# Patient Record
Sex: Female | Born: 1995 | Race: White | Hispanic: No | Marital: Single | Smoking: Never smoker
Health system: Southern US, Community
[De-identification: ages and names within clinical notes are randomized; demographics above are authoritative.]

## PROBLEM LIST (undated history)

## (undated) HISTORY — PX: WRIST SURGERY: SHX841

---
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
## 2017-02-27 ENCOUNTER — Emergency Department (INDEPENDENT_AMBULATORY_CARE_PROVIDER_SITE_OTHER)
Admission: EM | Admit: 2017-02-27 | Discharge: 2017-02-27 | Disposition: A | Discharge status: Home / Self Care | Payer: Self-pay | Source: Home / Self Care | Attending: Family Medicine | Admitting: Family Medicine

## 2017-02-27 ENCOUNTER — Encounter: Payer: Self-pay | Admitting: Emergency Medicine

## 2017-02-27 DIAGNOSIS — Z025 Encounter for examination for participation in sport: Secondary | ICD-10-CM

## 2017-02-27 NOTE — ED Provider Notes (Signed)
[facility]    CSN: [id number] Arrival date & time: 02/27/17  4098     History   Chief Complaint Chief Complaint  Patient presents with  . SPORTSEXAM    HPI Anita Jimenez is a 21 y.o. female.   Presents for a sports physical exam with no complaints.    The history is provided by the patient and a parent.    History reviewed. No pertinent past medical history.  Patient Active Problem List   Diagnosis Date Noted  . 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 WRIST SURGERY      OB History    No data available       Home Medications    Prior to Admission medications   Not on File    Family History Family History  Problem Relation Age of Onset  . Cancer Other     prostate  No family history of sudden death in a young person or young athlete.   Social History Social History  Substance Use Topics  . Smoking status: Never Smoker  . Smokeless tobacco: Never Used  . Alcohol use No     Allergies   Patient has no known allergies.   Review of Systems Review of Systems  Constitutional: Negative for chills and fever.  HENT: Negative for ear pain and sore throat.   Eyes: Negative for pain and visual disturbance.  Respiratory: Negative for cough and shortness of breath.   Cardiovascular: Negative for chest pain and palpitations.  Gastrointestinal: Negative for abdominal pain and vomiting.  Genitourinary: Negative for dysuria and hematuria.  Musculoskeletal: Negative for arthralgias and back pain.  Skin: Negative for color change and rash.  Neurological: Negative for seizures and syncope.  All other systems reviewed and are negative. Denies chest pain with activity.  No history of loss of consciousness during exercise.  No history of prolonged shortness of breath during exercise.       Physical Exam Triage Vital Signs ED Triage Vitals  Enc Vitals Group   BP 02/27/17 0847 113/71     Pulse Rate 02/27/17 0847 62     Resp --      Temp 02/27/17 0847 98 F (36.7 C)     Temp Source 02/27/17 0847 Oral     SpO2 02/27/17 0847 100 %     Weight 02/27/17 0848 120 lb (54.4 kg)     Height 02/27/17 0848 5' 5.5" (1.664 m)     Head Circumference --      Peak Flow --      Pain Score 02/27/17 0848 0     Pain Loc --      Pain Edu? --      Excl. in GC? --    No data found.   Updated Vital Signs BP 113/71 (BP Location: Right Arm)   Pulse 62   Temp 98 F (36.7 C) (Oral)   Ht 5' 5.5" (1.664 m)   Wt 120 lb (54.4 kg)   SpO2 100%   BMI 19.67 kg/m   Visual Acuity Right Eye Distance:   Left Eye Distance:   Bilateral Distance:    Right Eye Near:   Left Eye Near:    Bilateral Near:     Physical Exam  Constitutional: She is oriented to person, place, and time. She appears well-developed and well-nourished. No distress.  See also form, to be scanned into chart.  HENT:  Head:  Normocephalic and atraumatic.  Right Ear: External ear normal.  Left Ear: External ear normal.  Nose: Nose normal.  Mouth/Throat: Oropharynx is clear and moist.  Eyes: Conjunctivae and EOM are normal. Pupils are equal, round, and reactive to light. Right eye exhibits no discharge. Left eye exhibits no discharge. No scleral icterus.  Neck: Normal range of motion. Neck supple. No thyromegaly present.  Cardiovascular: Normal rate, regular rhythm and normal heart sounds.   No murmur heard. Pulmonary/Chest: Effort normal and breath sounds normal. She has no wheezes.  Abdominal: Soft. She exhibits no mass. There is no hepatosplenomegaly. There is no tenderness.  Musculoskeletal: Normal range of motion.       Right shoulder: Normal.       Left shoulder: Normal.       Right elbow: Normal.      Left elbow: Normal.       Right wrist: Normal.       Left wrist: Normal.       Right hip: Normal.       Left hip: Normal.       Right knee: Normal.       Left knee: Normal.        Right ankle: Normal.       Left ankle: Normal.       Cervical back: Normal.       Thoracic back: Normal.       Lumbar back: Normal.       Right upper arm: Normal.       Left upper arm: Normal.       Right forearm: Normal.       Left forearm: Normal.       Right hand: Normal.       Left hand: Normal.       Right upper leg: Normal.       Left upper leg: Normal.       Right lower leg: Normal.       Left lower leg: Normal.       Right foot: Normal.       Left foot: Normal.       Lymphadenopathy:    She has no cervical adenopathy.  Neurological: She is alert and oriented to person, place, and time. She has normal reflexes. She exhibits normal muscle tone.  Neuro exam: within normal limits   Skin: Skin is warm and dry. No rash noted.  within normal limits   Psychiatric: She has a normal mood and affect. Her behavior is normal.  Nursing note and vitals reviewed.    UC Treatments / Results  Labs (all labs ordered are listed, but only abnormal results are displayed) Labs Reviewed - No data to display  EKG  EKG Interpretation None       Radiology No results found.  Procedures Procedures (including critical care time)  Medications Ordered in UC Medications - No data to display   Initial Impression / Assessment and Plan / UC Course  I have reviewed the triage vital signs and the nursing notes.  Pertinent labs & imaging results that were available during my care of the patient were reviewed by me and considered in my medical decision making (see chart for details).    NO CONTRAINDICATIONS TO SPORTS PARTICIPATION  Sports physical exam form completed.  Level of Service:  No Charge Patient Arrived [facility] sports exam fee collected at time of service      Final Clinical Impressions(s) / UC Diagnoses   Final diagnoses:  Routine sports physical exam    New Prescriptions New Prescriptions   No medications on file     Lattie Haw, MD 02/27/17 [phone]

## 2017-02-27 NOTE — ED Triage Notes (Signed)
Pt here for sports PE for a gymnastics camp. No complaints.

## 2020-06-23 ENCOUNTER — Ambulatory Visit (INDEPENDENT_AMBULATORY_CARE_PROVIDER_SITE_OTHER): Payer: BLUE CROSS/BLUE SHIELD

## 2020-06-23 ENCOUNTER — Ambulatory Visit
Admission: RE | Admit: 2020-06-23 | Discharge: 2020-06-23 | Disposition: A | Discharge status: Home / Self Care | Payer: BLUE CROSS/BLUE SHIELD | Source: Ambulatory Visit | Attending: Family Medicine | Admitting: Family Medicine

## 2020-06-23 ENCOUNTER — Other Ambulatory Visit: Payer: Self-pay

## 2020-06-23 VITALS — BP 127/75 | HR 76 | Temp 98.9°F | Resp 18

## 2020-06-23 DIAGNOSIS — S93401A Sprain of unspecified ligament of right ankle, initial encounter: Secondary | ICD-10-CM

## 2020-06-23 DIAGNOSIS — S96911A Strain of unspecified muscle and tendon at ankle and foot level, right foot, initial encounter: Secondary | ICD-10-CM | POA: Diagnosis not present

## 2020-06-23 DIAGNOSIS — Y9368 Activity, volleyball (beach) (court): Secondary | ICD-10-CM | POA: Diagnosis not present

## 2020-06-23 NOTE — ED Triage Notes (Signed)
Pt here for right ankle pain after injuring on Friday

## 2020-06-23 NOTE — ED Provider Notes (Signed)
[facility]    CSN: [id number] Arrival date & time: 06/23/20  0946      History   Chief Complaint Chief Complaint  Patient presents with  . Ankle Pain    HPI Anita Jimenez is a 24 y.o. female.   Patient was playing volleyball last week her Soo did not slide as expected and she twisted her ankle.  Does not really remember mechanism of fall.  Past history of injury to that ankle.  She ambulates in without any noticeable limp  HPI  History reviewed. No pertinent past medical history.  Patient Active Problem List   Diagnosis Date Noted  . 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 WRIST SURGERY      OB History   No obstetric history on file.      Home Medications    Prior to Admission medications   Not on File    Family History Family History  Problem Relation Age of Onset  . Cancer Other        prostate    Social History Social History   Tobacco Use  . Smoking status: Never Smoker  . Smokeless tobacco: Never Used  Substance Use Topics  . Alcohol use: No  . Drug use: No     Allergies   Patient has no known allergies.   Review of Systems Review of Systems  Musculoskeletal: Positive for arthralgias and gait problem.  All other systems reviewed and are negative.    Physical Exam Triage Vital Signs ED Triage Vitals  Enc Vitals Group     BP 06/23/20 1004 127/75     Pulse Rate 06/23/20 1004 76     Resp 06/23/20 1004 18     Temp 06/23/20 1004 98.9 F (37.2 C)     Temp Source 06/23/20 1004 Oral     SpO2 06/23/20 1004 98 %     Weight --      Height --      Head Circumference --      Peak Flow --      Pain Score 06/23/20 1005 8     Pain Loc --      Pain Edu? --      Excl. in GC? --    No data found.  Updated Vital Signs BP 127/75 (BP Location: Right Arm)   Pulse 76   Temp 98.9 F (37.2 C) (Oral)   Resp 18   SpO2 98%   Visual Acuity Right  Eye Distance:   Left Eye Distance:   Bilateral Distance:    Right Eye Near:   Left Eye Near:    Bilateral Near:     Physical Exam Vitals and nursing note reviewed.  Constitutional:      Appearance: Normal appearance.  Musculoskeletal:     Comments: Right ankle: Minimal swelling Tender over lateral malleolus Decreased strength with eversion and normal strength with inversion  Neurological:     Mental Status: She is alert.      UC Treatments / Results  Labs (all labs ordered are listed, but only abnormal results are displayed) Labs Reviewed - No data to display  EKG   Radiology No results found.  X-ray shows no evidence of fracture and ankle mortise appears symmetric  Procedures Procedures (including critical care time)  Medications Ordered in UC Medications - No data to display  Initial Impression / Assessment and Plan / UC Course  I  have reviewed the triage vital signs and the nursing notes.  Pertinent labs & imaging results that were available during my care of the patient were reviewed by me and considered in my medical decision making (see chart for details).     Sprain right ankle    Final Clinical Impressions(s) / UC Diagnoses   Final diagnoses:  None   Discharge Instructions   None    ED Prescriptions    None     PDMP not reviewed this encounter.   Frederica Kuster, MD 06/23/20 1024

## 2020-08-26 ENCOUNTER — Ambulatory Visit: Payer: Self-pay

## 2021-09-16 IMAGING — DX DG ANKLE COMPLETE 3+V*R*
3 series · 3 of 3 positions shown · non-contrast
Comparison: None.

CLINICAL DATA: Right ankle pain after injury 4 days ago.

EXAM:
RIGHT ANKLE - COMPLETE 3+ VIEW

[ankle ap]
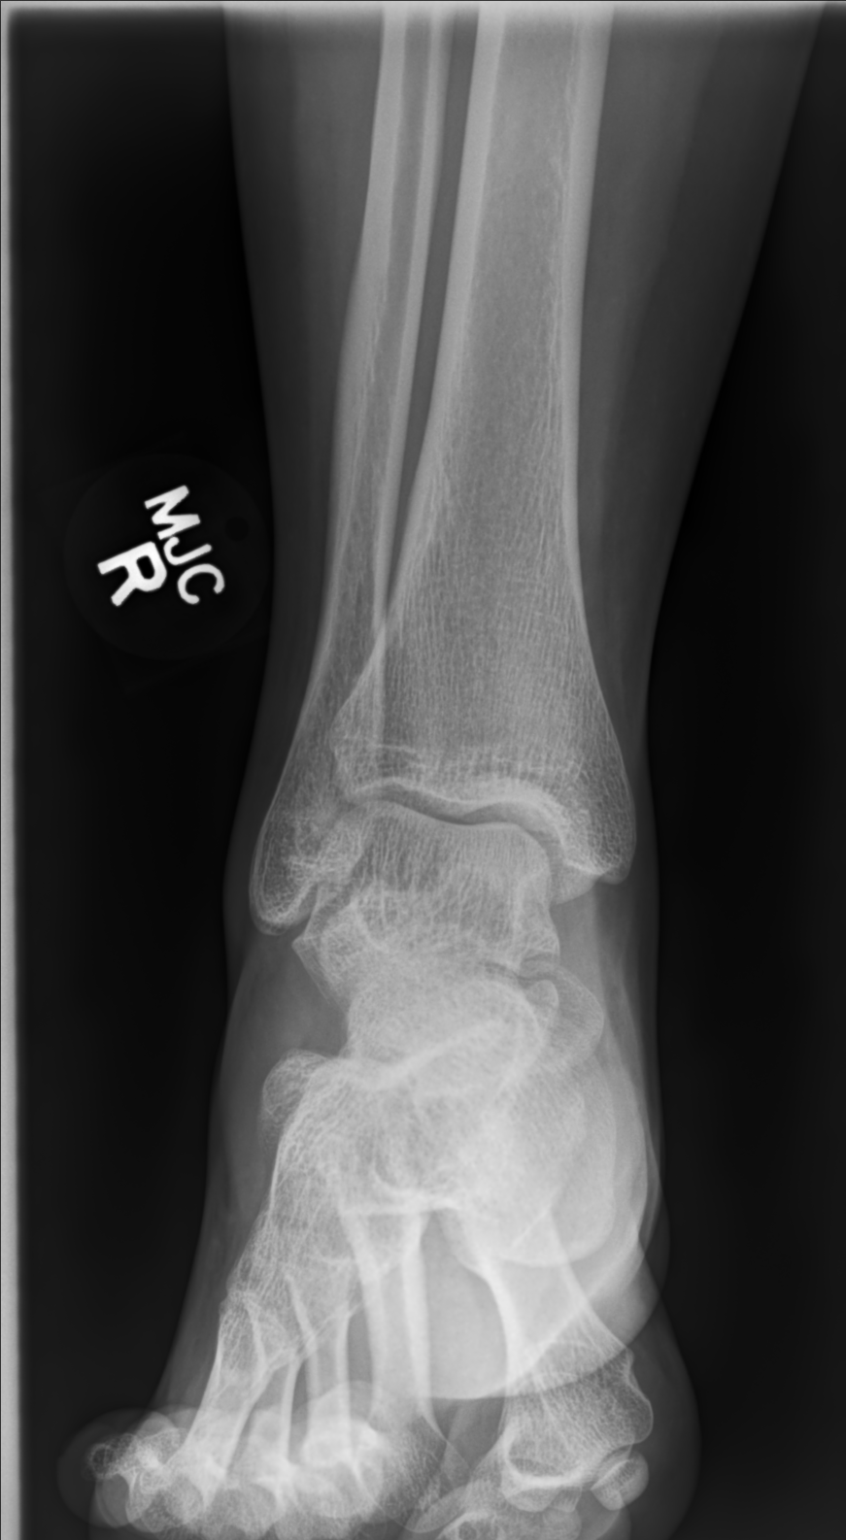

[ankle medial oblique]
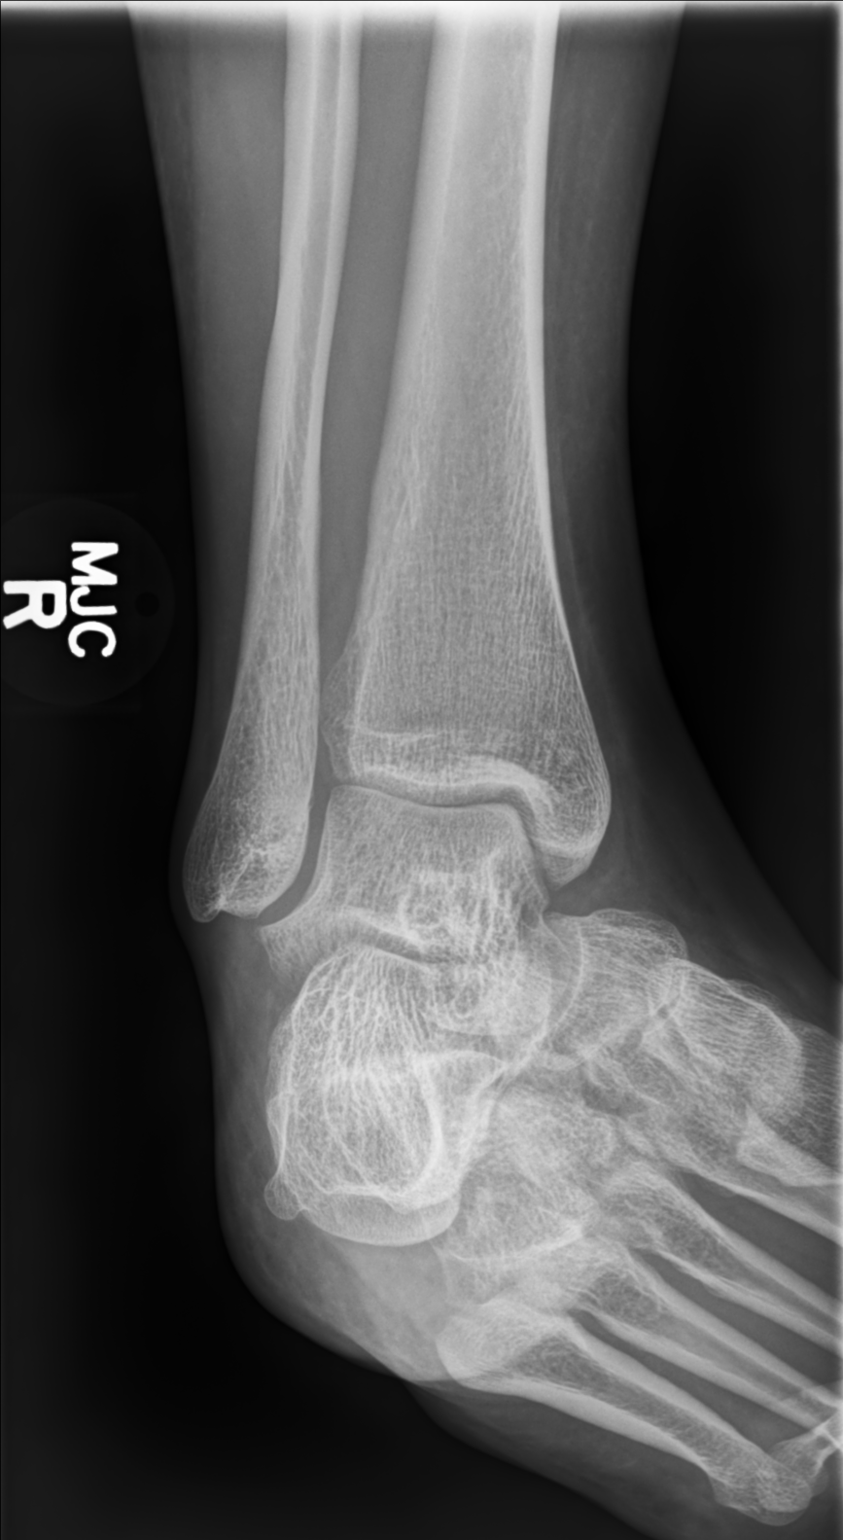

[ankle lat]
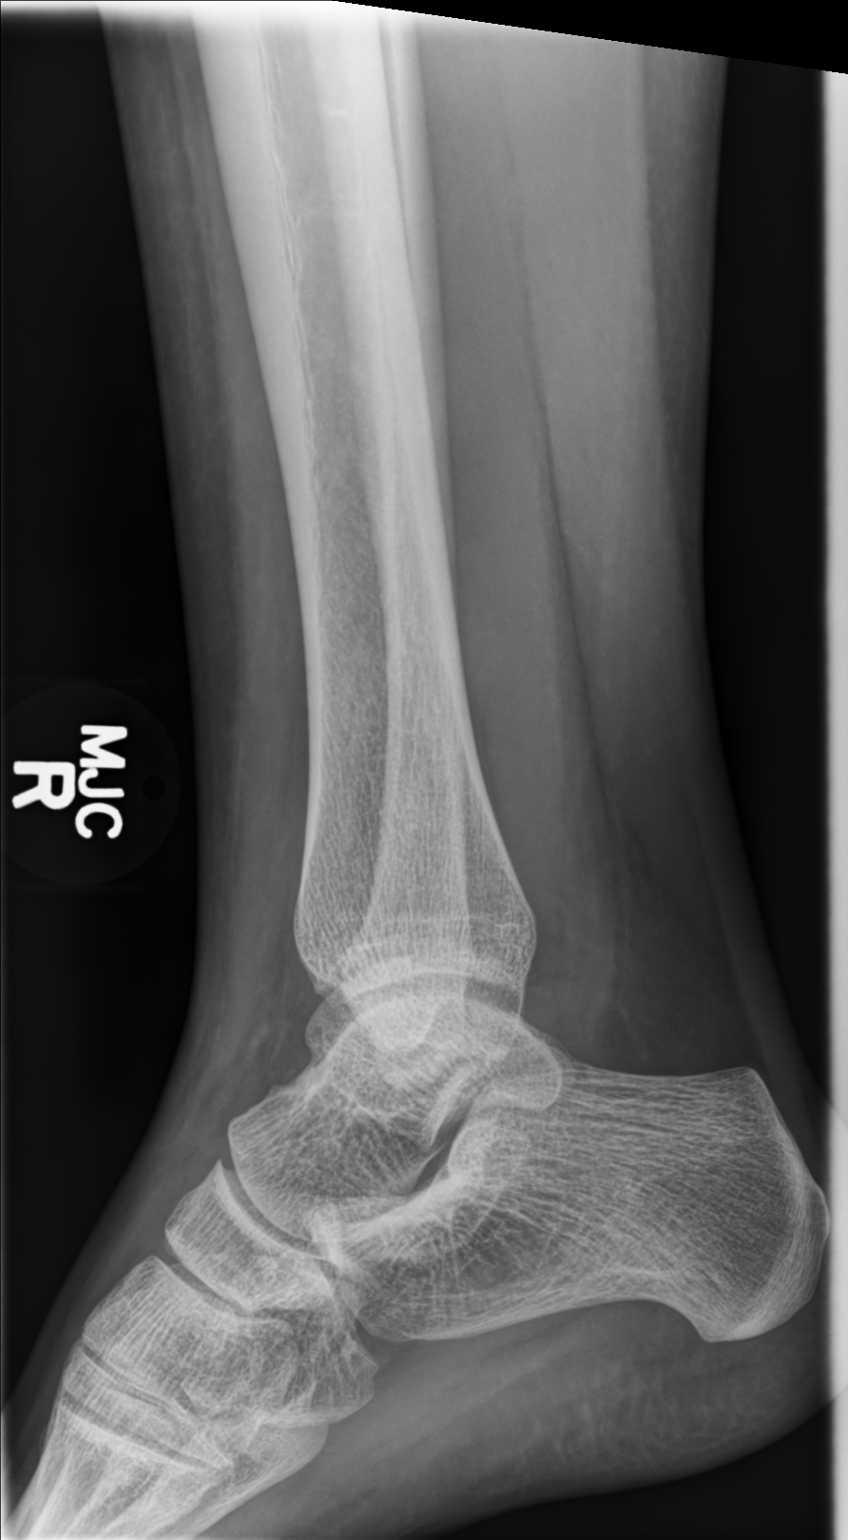

[3 of 3 positions shown; findings below may reference images not displayed]

FINDINGS: There is no evidence of fracture, dislocation, or joint effusion.
There is no evidence of arthropathy or other focal bone abnormality.
Soft tissues are unremarkable.
IMPRESSION: Negative.

## 2022-06-14 ENCOUNTER — Emergency Department
Admission: RE | Admit: 2022-06-14 | Discharge: 2022-06-14 | Disposition: A | Discharge status: Home / Self Care | Payer: BC Managed Care – PPO | Source: Ambulatory Visit | Attending: Family Medicine | Admitting: Family Medicine

## 2022-06-14 ENCOUNTER — Emergency Department (INDEPENDENT_AMBULATORY_CARE_PROVIDER_SITE_OTHER): Payer: BC Managed Care – PPO

## 2022-06-14 VITALS — BP 118/78 | HR 69 | Temp 99.4°F | Resp 18 | Ht 65.0 in | Wt 142.0 lb

## 2022-06-14 DIAGNOSIS — M25571 Pain in right ankle and joints of right foot: Secondary | ICD-10-CM | POA: Diagnosis not present

## 2022-06-14 MED ORDER — IBUPROFEN 600 MG PO TABS: 600.0000 mg | ORAL_TABLET | Freq: Four times a day (QID) | ORAL | 0 refills | Status: AC | PRN

## 2022-06-14 NOTE — ED Triage Notes (Signed)
Patient states that she rolled her right ankle this morning while "jumping the creek."  Patient denies taken any OTC pain meds.  Just applied ice.

## 2022-06-14 NOTE — ED Provider Notes (Signed)
[facility]    CSN: [id number] Arrival date & time: 06/14/22  1417      History   Chief Complaint Chief Complaint  Patient presents with   Ankle Pain    Entered by patient   Appointment    HPI Anita Jimenez is a 26 y.o. female.   HPI  Patient rolled her ankle this morning while jumping over ropes in a game plate at summer camp.  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  She states she had an inversion injury.  She now has pain in the outside of her ankle, swelling, and pain with weightbearing.  She has applied ice  History reviewed. No pertinent past medical history.  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      Home Medications    Prior to Admission medications   Medication Sig Start Date End Date Taking? Authorizing Provider  ibuprofen (ADVIL) 600 MG tablet Take 1 tablet (600 mg total) by mouth every 6 (six) hours as needed. 06/14/22  Yes Eustace Moore, MD  norethindrone-ethinyl estradiol 1/35 (ORTHO-NOVUM) tablet Take 1 tablet by mouth daily. 06/02/22  Yes [provider]  sertraline (ZOLOFT) 50 MG tablet TAKE 1 AND 1/2 TABLETS(75 MG) BY MOUTH DAILY 03/02/20  Yes [provider]    Family History Family History  Problem Relation Age of Onset   Cancer Other        prostate    Social History Social History   Tobacco Use   Smoking status: Never   Smokeless tobacco: Never  Substance Use Topics   Alcohol use: No   Drug use: No     Allergies   Patient has no known allergies.   Review of Systems Review of Systems See HPI  Physical Exam Triage Vital Signs ED Triage Vitals  Enc Vitals Group     BP 06/14/22 1435 118/78     Pulse Rate 06/14/22 1435 69     Resp 06/14/22 1435 18     Temp 06/14/22 1435 99.4 F (37.4  C)     Temp Source 06/14/22 1435 Oral     SpO2 06/14/22 1435 100 %     Weight 06/14/22 1436 142 lb (64.4 kg)     Height 06/14/22 1436 5\' 5"  (1.651 m)     Head Circumference --      Peak Flow --      Pain Score 06/14/22 1436 7     Pain Loc --      Pain Edu? --      Excl. in GC? --    No data found.  Updated Vital Signs BP 118/78 (BP Location: Left Arm)   Pulse 69   Temp 99.4 F (37.4 C) (Oral)   Resp 18   Ht 5\' 5"  (1.651 m)   Wt 64.4 kg   LMP 05/30/2022   SpO2 100%   BMI 23.63 kg/m      Physical Exam Constitutional:      General: She is not in acute distress.    Appearance: She is well-developed.  HENT:     Head: Normocephalic and atraumatic.  Eyes:     Conjunctiva/sclera: Conjunctivae normal.     Pupils: Pupils are equal, round, and reactive to light.  Cardiovascular:  Rate and Rhythm: Normal rate.  Pulmonary:     Effort: Pulmonary effort is normal. No respiratory distress.  Abdominal:     General: There is no distension.     Palpations: Abdomen is soft.  Musculoskeletal:        General: Swelling and tenderness present. Normal range of motion.     Cervical back: Normal range of motion.     Comments: Right ankle has swelling laterally.  Tenderness of the tip of the lateral mall malleolus and the lateral foot.  Good range of motion, but slow.  Pain with inversion.  No instability  Skin:    General: Skin is warm and dry.  Neurological:     Mental Status: She is alert.     Gait: Gait abnormal.      UC Treatments / Results  Labs (all labs ordered are listed, but only abnormal results are displayed) Labs Reviewed - No data to display  EKG   Radiology DG Ankle Complete Right  Result Date: 06/14/2022 CLINICAL DATA:  Right ankle pain EXAM: RIGHT ANKLE - COMPLETE 3+ VIEW COMPARISON:  Radiographs 06/23/2020 FINDINGS: Soft tissue swelling over the lateral malleolus. No acute fracture or dislocation. IMPRESSION: Soft tissue swelling about the lateral  malleolus.  No fracture. Electronically Signed   By: Minerva Fester M.D.   On: 06/14/2022 15:03    Procedures Procedures (including critical care time)  Medications Ordered in UC Medications - No data to display  Initial Impression / Assessment and Plan / UC Course  I have reviewed the triage vital signs and the nursing notes.  Pertinent labs & imaging results that were available during my care of the patient were reviewed by me and considered in my medical decision making (see chart for details).     Ankle sprain discussed. Final Clinical Impressions(s) / UC Diagnoses   Final diagnoses:  Acute right ankle pain     Discharge Instructions      Use ice and elevation to reduce swelling Walk as much as you can tolerate.  No running or sports Wear brace until you can walk without limp.  It needs to be worn inside a lace up shoe Take ibuprofen as needed for pain See your primary care doctor or sports medicine if you fail to improve over the next couple of weeks     ED Prescriptions     Medication Sig Dispense Auth. Provider   ibuprofen (ADVIL) 600 MG tablet Take 1 tablet (600 mg total) by mouth every 6 (six) hours as needed. 30 tablet Eustace Moore, MD      PDMP not reviewed this encounter.   Eustace Moore, MD 06/14/22 [phone]

## 2022-06-14 NOTE — Discharge Instructions (Addendum)
Use ice and elevation to reduce swelling Walk as much as you can tolerate.  No running or sports Wear brace until you can walk without limp.  It needs to be worn inside a lace up shoe Take ibuprofen as needed for pain See your primary care doctor or sports medicine if you fail to improve over the next couple of weeks

## 2024-06-28 ENCOUNTER — Telehealth (INDEPENDENT_AMBULATORY_CARE_PROVIDER_SITE_OTHER): Payer: Self-pay

## 2024-06-28 NOTE — Telephone Encounter (Signed)
  From: Adina Collum - [employer]  [name]- patient MRN [id number]  [facility] Anita Jimenez, this patient has not reponsed to our calls and texts since 8/5. Just wanted to make you aware.
